# Patient Record
Sex: Female | Born: 1995 | Race: Black or African American | Hispanic: No | Marital: Single | Smoking: Never smoker
Health system: Southern US, Community
[De-identification: ages and names within clinical notes are randomized; demographics above are authoritative.]

## PROBLEM LIST (undated history)

## (undated) DIAGNOSIS — J45909 Unspecified asthma, uncomplicated: Secondary | ICD-10-CM

---

## 2019-05-25 ENCOUNTER — Other Ambulatory Visit: Payer: Self-pay

## 2019-05-25 DIAGNOSIS — Z20822 Contact with and (suspected) exposure to covid-19: Secondary | ICD-10-CM

## 2019-05-27 LAB — NOVEL CORONAVIRUS, NAA: SARS-CoV-2, NAA: NOT DETECTED

## 2019-12-09 ENCOUNTER — Emergency Department (HOSPITAL_COMMUNITY): Payer: Self-pay

## 2019-12-09 ENCOUNTER — Emergency Department (HOSPITAL_COMMUNITY)
Admission: EM | Admit: 2019-12-09 | Discharge: 2019-12-09 | Disposition: A | Discharge status: Home / Self Care | Payer: Self-pay | Attending: Emergency Medicine | Admitting: Emergency Medicine

## 2019-12-09 ENCOUNTER — Encounter (HOSPITAL_COMMUNITY): Payer: Self-pay

## 2019-12-09 DIAGNOSIS — T486X5A Adverse effect of antiasthmatics, initial encounter: Secondary | ICD-10-CM | POA: Insufficient documentation

## 2019-12-09 DIAGNOSIS — T50905A Adverse effect of unspecified drugs, medicaments and biological substances, initial encounter: Secondary | ICD-10-CM

## 2019-12-09 DIAGNOSIS — T887XXA Unspecified adverse effect of drug or medicament, initial encounter: Secondary | ICD-10-CM | POA: Insufficient documentation

## 2019-12-09 DIAGNOSIS — J4521 Mild intermittent asthma with (acute) exacerbation: Secondary | ICD-10-CM | POA: Insufficient documentation

## 2019-12-09 DIAGNOSIS — Z20822 Contact with and (suspected) exposure to covid-19: Secondary | ICD-10-CM | POA: Insufficient documentation

## 2019-12-09 HISTORY — DX: Unspecified asthma, uncomplicated: J45.909

## 2019-12-09 LAB — CBC WITH DIFFERENTIAL/PLATELET
Abs Immature Granulocytes: 0.04 10*3/uL (ref 0.00–0.07)
Basophils Absolute: 0.1 10*3/uL (ref 0.0–0.1)
Basophils Relative: 1 %
Eosinophils Absolute: 0.7 10*3/uL — ABNORMAL HIGH (ref 0.0–0.5)
Eosinophils Relative: 9 %
HCT: 41.2 % (ref 36.0–46.0)
Hemoglobin: 13.9 g/dL (ref 12.0–15.0)
Immature Granulocytes: 1 %
Lymphocytes Relative: 26 %
Lymphs Abs: 1.9 10*3/uL (ref 0.7–4.0)
MCH: 30.5 pg (ref 26.0–34.0)
MCHC: 33.7 g/dL (ref 30.0–36.0)
MCV: 90.4 fL (ref 80.0–100.0)
Monocytes Absolute: 0.5 10*3/uL (ref 0.1–1.0)
Monocytes Relative: 7 %
Neutro Abs: 4.1 10*3/uL (ref 1.7–7.7)
Neutrophils Relative %: 56 %
Platelets: 277 10*3/uL (ref 150–400)
RBC: 4.56 MIL/uL (ref 3.87–5.11)
RDW: 12.2 % (ref 11.5–15.5)
WBC: 7.3 10*3/uL (ref 4.0–10.5)
nRBC: 0 % (ref 0.0–0.2)

## 2019-12-09 LAB — I-STAT CHEM 8, ED
BUN: 12 mg/dL (ref 6–20)
Calcium, Ion: 1.24 mmol/L (ref 1.15–1.40)
Chloride: 105 mmol/L (ref 98–111)
Creatinine, Ser: 1 mg/dL (ref 0.44–1.00)
Glucose, Bld: 97 mg/dL (ref 70–99)
HCT: 40 % (ref 36.0–46.0)
Hemoglobin: 13.6 g/dL (ref 12.0–15.0)
Potassium: 3.4 mmol/L — ABNORMAL LOW (ref 3.5–5.1)
Sodium: 142 mmol/L (ref 135–145)
TCO2: 25 mmol/L (ref 22–32)

## 2019-12-09 LAB — I-STAT BETA HCG BLOOD, ED (MC, WL, AP ONLY): I-stat hCG, quantitative: <5 m[IU]/mL (ref <5)

## 2019-12-09 LAB — SARS CORONAVIRUS 2 BY RT PCR (HOSPITAL ORDER, PERFORMED IN ~~LOC~~ HOSPITAL LAB): SARS Coronavirus 2: NEGATIVE

## 2019-12-09 MED ORDER — IOHEXOL 350 MG/ML SOLN
100.0000 mL | Freq: Once | INTRAVENOUS | Status: AC | PRN
  Administered 2019-12-09: 100 mL via INTRAVENOUS

## 2019-12-09 MED ORDER — ALBUTEROL SULFATE (2.5 MG/3ML) 0.083% IN NEBU
5.0000 mg | INHALATION_SOLUTION | Freq: Once | RESPIRATORY_TRACT | Status: AC
  Administered 2019-12-09: 5 mg via RESPIRATORY_TRACT
  Filled 2019-12-09: qty 6

## 2019-12-09 MED ORDER — SODIUM CHLORIDE 0.9 % IV BOLUS
500.0000 mL | Freq: Once | INTRAVENOUS | Status: AC
  Administered 2019-12-09: 500 mL via INTRAVENOUS

## 2019-12-09 MED ORDER — PREDNISONE 20 MG PO TABS: ORAL_TABLET | ORAL | 0 refills | Status: AC

## 2019-12-09 MED ORDER — PREDNISONE 20 MG PO TABS
60.0000 mg | ORAL_TABLET | Freq: Once | ORAL | Status: AC
  Administered 2019-12-09: 60 mg via ORAL
  Filled 2019-12-09: qty 3

## 2019-12-09 MED ORDER — SODIUM CHLORIDE (PF) 0.9 % IJ SOLN
INTRAMUSCULAR | Status: AC
  Filled 2019-12-09: qty 50

## 2019-12-09 NOTE — ED Triage Notes (Signed)
Arrived POV from home patient reports cough and shortness and breath since around 1AM. Patient reports using nebulizer treatment and neb treatment without any improvement

## 2019-12-09 NOTE — ED Provider Notes (Signed)
[facility]-EMERGENCY DEPT Provider Note   CSN: [id number] Arrival date & time: 12/09/19  0315     History Chief Complaint  Patient presents with  . asthma exacerbation    Katie Lawrence is a 24 y.o. female.  The history is provided by the patient.  Wheezing Severity:  Moderate Severity compared to prior episodes:  Similar Onset quality:  Sudden Duration:  4 hours Timing:  Constant Progression:  Partially resolved Chronicity:  New Context: not pollens and not smoke exposure   Relieved by:  Nothing Worsened by:  Nothing Ineffective treatments:  Beta-agonist inhaler and home nebulizer (at least 7 treatments since 1 am ) Associated symptoms: no chest pain, no ear pain, no fever, no foot swelling, no headaches, no orthopnea, no PND, no rash, no rhinorrhea, no shortness of breath, no sore throat, no sputum production, no stridor and no swollen glands   Risk factors: not exposed to toxic fumes        Past Medical History:  Diagnosis Date  . Asthma     There are no problems to display for this patient.   History reviewed. No pertinent surgical history.   OB History   No obstetric history on file.     History reviewed. No pertinent family history.  Social History   Tobacco Use  . Smoking status: Never Smoker  . Smokeless tobacco: Never Used  Substance Use Topics  . Alcohol use: Never  . Drug use: Never    Home Medications Prior to Admission medications   Medication Sig Start Date End Date Taking? Authorizing Provider  predniSONE (DELTASONE) 20 MG tablet 3 tabs po day one, then 2 po daily x 4 days 12/09/19   Nicanor Alcon, Diasha Castleman, MD    Allergies    Patient has no known allergies.  Review of Systems   Review of Systems  Constitutional: Negative for fever.  HENT: Negative for ear pain, rhinorrhea and sore throat.   Eyes: Negative for photophobia.  Respiratory: Positive for wheezing. Negative for sputum production, shortness of breath and  stridor.   Cardiovascular: Negative for chest pain, orthopnea and PND.  Gastrointestinal: Negative for abdominal pain.  Skin: Negative for rash.  Neurological: Negative for headaches.  Psychiatric/Behavioral: Negative for agitation.  All other systems reviewed and are negative.   Physical Exam Updated Vital Signs BP 114/60 (BP Location: Left Arm)   Pulse (!) 120   Temp 98.5 F (36.9 C) (Oral)   Resp 13   LMP 12/06/2019 Comment: negative beta HCG 12/09/19  SpO2 100%   Physical Exam Vitals and nursing note reviewed.  Constitutional:      General: She is not in acute distress.    Appearance: Normal appearance.  HENT:     Head: Normocephalic and atraumatic.     Nose: Nose normal.  Eyes:     Conjunctiva/sclera: Conjunctivae normal.     Pupils: Pupils are equal, round, and reactive to light.  Cardiovascular:     Rate and Rhythm: Normal rate and regular rhythm.     Pulses: Normal pulses.     Heart sounds: Normal heart sounds.  Pulmonary:     Effort: Pulmonary effort is normal. No respiratory distress.     Breath sounds: Normal breath sounds. No wheezing or rales.  Abdominal:     General: Abdomen is flat. Bowel sounds are normal.     Tenderness: There is no abdominal tenderness. There is no guarding or rebound.  Musculoskeletal:        General:  Normal range of motion.     Cervical back: Normal range of motion and neck supple.  Skin:    General: Skin is warm and dry.     Capillary Refill: Capillary refill takes less than 2 seconds.  Neurological:     General: No focal deficit present.     Mental Status: She is alert and oriented to person, place, and time.     Deep Tendon Reflexes: Reflexes normal.  Psychiatric:        Mood and Affect: Mood is anxious.     ED Results / Procedures / Treatments   Labs (all labs ordered are listed, but only abnormal results are displayed) Results for orders placed or performed during the hospital encounter of 12/09/19  SARS Coronavirus 2  by RT PCR (hospital order, performed in [facility] hospital lab) Nasopharyngeal Nasopharyngeal Swab   Specimen: Nasopharyngeal Swab  Result Value Ref Range   SARS Coronavirus 2 NEGATIVE NEGATIVE  CBC with Differential/Platelet  Result Value Ref Range   WBC 7.3 4.0 - 10.5 K/uL   RBC 4.56 3.87 - 5.11 MIL/uL   Hemoglobin 13.9 12.0 - 15.0 g/dL   HCT 22.0 36 - 46 %   MCV 90.4 80.0 - 100.0 fL   MCH 30.5 26.0 - 34.0 pg   MCHC 33.7 30.0 - 36.0 g/dL   RDW 25.4 27.0 - 62.3 %   Platelets 277 150 - 400 K/uL   nRBC 0.0 0.0 - 0.2 %   Neutrophils Relative % 56 %   Neutro Abs 4.1 1.7 - 7.7 K/uL   Lymphocytes Relative 26 %   Lymphs Abs 1.9 0.7 - 4.0 K/uL   Monocytes Relative 7 %   Monocytes Absolute 0.5 0 - 1 K/uL   Eosinophils Relative 9 %   Eosinophils Absolute 0.7 (H) 0 - 0 K/uL   Basophils Relative 1 %   Basophils Absolute 0.1 0 - 0 K/uL   Immature Granulocytes 1 %   Abs Immature Granulocytes 0.04 0.00 - 0.07 K/uL  I-stat chem 8, ED (not at [facility] or [facility])  Result Value Ref Range   Sodium 142 135 - 145 mmol/L   Potassium 3.4 (L) 3.5 - 5.1 mmol/L   Chloride 105 98 - 111 mmol/L   BUN 12 6 - 20 mg/dL   Creatinine, Ser 7.62 0.44 - 1.00 mg/dL   Glucose, Bld 97 70 - 99 mg/dL   Calcium, Ion 8.31 5.17 - 1.40 mmol/L   TCO2 25 22 - 32 mmol/L   Hemoglobin 13.6 12.0 - 15.0 g/dL   HCT 61.6 36 - 46 %  I-Stat Beta hCG blood, ED ([facility], [facility], [facility] only)  Result Value Ref Range   I-stat hCG, quantitative <5.0 <5 mIU/mL   Comment 3           DG Chest 2 View  Result Date: 12/09/2019 CLINICAL DATA:  Asthma exacerbation EXAM: CHEST - 2 VIEW COMPARISON:  None. FINDINGS: The heart size and mediastinal contours are within normal limits. Mildly increased reticulonodular opacity and peribronchial cuffing seen within the perihilar regions the visualized skeletal structures are unremarkable. IMPRESSION: Findings which could be suggestive of reactive airway disease Electronically Signed   By: Jonna Clark M.D.   On:  12/09/2019 03:51   CT Angio Chest PE W and/or Wo Contrast  Result Date: 12/09/2019 CLINICAL DATA:  Shortness of breath EXAM: CT ANGIOGRAPHY CHEST WITH CONTRAST TECHNIQUE: Multidetector CT imaging of the chest was performed using the standard protocol during bolus administration of  intravenous contrast. Multiplanar CT image reconstructions and MIPs were obtained to evaluate the vascular anatomy. CONTRAST:  164mL OMNIPAQUE IOHEXOL 350 MG/ML SOLN COMPARISON:  Radiograph same day FINDINGS: Cardiovascular: There is a optimal opacification of the pulmonary arteries. There is no central,segmental, or subsegmental filling defects within the pulmonary arteries. The heart is normal in size. No pericardial effusion or thickening. No evidence right heart strain. There is normal three-vessel brachiocephalic anatomy without proximal stenosis. The thoracic aorta is normal in appearance. Mediastinum/Nodes: No hilar, mediastinal, or axillary adenopathy. Thyroid gland, trachea, and esophagus demonstrate no significant findings. Lungs/Pleura: The lungs are clear. No pleural effusion or pneumothorax. No airspace consolidation. Upper Abdomen: No acute abnormalities present in the visualized portions of the upper abdomen. Musculoskeletal: No chest wall abnormality. No acute or significant osseous findings. Review of the MIP images confirms the above findings. IMPRESSION: No central, segmental, or subsegmental pulmonary embolism. No acute intrathoracic pathology to explain the patient's symptoms Electronically Signed   By: Prudencio Pair M.D.   On: 12/09/2019 05:47    EKG None  Radiology DG Chest 2 View  Result Date: 12/09/2019 CLINICAL DATA:  Asthma exacerbation EXAM: CHEST - 2 VIEW COMPARISON:  None. FINDINGS: The heart size and mediastinal contours are within normal limits. Mildly increased reticulonodular opacity and peribronchial cuffing seen within the perihilar regions the visualized skeletal structures are unremarkable.  IMPRESSION: Findings which could be suggestive of reactive airway disease Electronically Signed   By: Prudencio Pair M.D.   On: 12/09/2019 03:51   CT Angio Chest PE W and/or Wo Contrast  Result Date: 12/09/2019 CLINICAL DATA:  Shortness of breath EXAM: CT ANGIOGRAPHY CHEST WITH CONTRAST TECHNIQUE: Multidetector CT imaging of the chest was performed using the standard protocol during bolus administration of intravenous contrast. Multiplanar CT image reconstructions and MIPs were obtained to evaluate the vascular anatomy. CONTRAST:  197mL OMNIPAQUE IOHEXOL 350 MG/ML SOLN COMPARISON:  Radiograph same day FINDINGS: Cardiovascular: There is a optimal opacification of the pulmonary arteries. There is no central,segmental, or subsegmental filling defects within the pulmonary arteries. The heart is normal in size. No pericardial effusion or thickening. No evidence right heart strain. There is normal three-vessel brachiocephalic anatomy without proximal stenosis. The thoracic aorta is normal in appearance. Mediastinum/Nodes: No hilar, mediastinal, or axillary adenopathy. Thyroid gland, trachea, and esophagus demonstrate no significant findings. Lungs/Pleura: The lungs are clear. No pleural effusion or pneumothorax. No airspace consolidation. Upper Abdomen: No acute abnormalities present in the visualized portions of the upper abdomen. Musculoskeletal: No chest wall abnormality. No acute or significant osseous findings. Review of the MIP images confirms the above findings. IMPRESSION: No central, segmental, or subsegmental pulmonary embolism. No acute intrathoracic pathology to explain the patient's symptoms Electronically Signed   By: Prudencio Pair M.D.   On: 12/09/2019 05:47    Procedures Procedures (including critical care time)  Medications Ordered in ED Medications  albuterol (PROVENTIL) (2.5 MG/3ML) 0.083% nebulizer solution 5 mg (5 mg Nebulization Given 12/09/19 0352)  sodium chloride 0.9 % bolus 500 mL (0 mLs  Intravenous Stopped 12/09/19 0509)  predniSONE (DELTASONE) tablet 60 mg (60 mg Oral Given 12/09/19 0433)  iohexol (OMNIPAQUE) 350 MG/ML injection 100 mL (100 mLs Intravenous Contrast Given 12/09/19 0535)  sodium chloride (PF) 0.9 % injection (  Given 12/09/19 0558)    ED Course  I have reviewed the triage vital signs and the nursing notes.  Pertinent labs & imaging results that were available during my care of the patient were reviewed by me  and considered in my medical decision making (see chart for details).  Tachycardia is from too much albuterol.  Patient is not wheezing and does not need any more albuterol.  Patient is also panicking and hyperventilating.  She is able to control her breathing with direction.  No PE on CT scan.  No PNA.  No signs of infection nor sepsis.  No more albuterol.  Stable for discharge.     [place] Heidt was evaluated in Emergency Department on 12/09/2019 for the symptoms described in the history of present illness. She was evaluated in the context of the global COVID-19 pandemic, which necessitated consideration that the patient might be at risk for infection with the SARS-CoV-2 virus that causes COVID-19. Institutional protocols and algorithms that pertain to the evaluation of patients at risk for COVID-19 are in a state of rapid change based on information released by regulatory bodies including the CDC and federal and state organizations. These policies and algorithms were followed during the patient's care in the ED.  Final Clinical Impression(s) / ED Diagnoses Final diagnoses:  Mild intermittent asthma with exacerbation  Adverse effect of drug, initial encounter  Return for intractable cough, coughing up blood,fevers >100.4 unrelieved by medication, shortness of breath, intractable vomiting, chest pain, shortness of breath, weakness,numbness, changes in speech, facial asymmetry,abdominal pain, passing out,Inability to tolerate liquids or food, cough, altered  mental status or any concerns. No signs of systemic illness or infection. The patient is nontoxic-appearing on exam and vital signs are within normal limits.   I have reviewed the triage vital signs and the nursing notes. Pertinent labs &imaging results that were available during my care of the patient were reviewed by me and considered in my medical decision making (see chart for details).After history, exam, and medical workup I feel the patient has beenappropriately medically screened and is safe for discharge home. Pertinent diagnoses were discussed with the patient. Patient was given return precautions.     Rx / DC Orders ED Discharge Orders         Ordered    predniSONE (DELTASONE) 20 MG tablet     Discontinue  Reprint     12/09/19 0604           Vana Arif, MD 12/09/19 3818

## 2021-06-21 IMAGING — CR DG CHEST 2V
2 series · 2 of 2 positions shown · non-contrast
Comparison: None.

CLINICAL DATA: Asthma exacerbation

EXAM:
CHEST - 2 VIEW

[w chest lat]
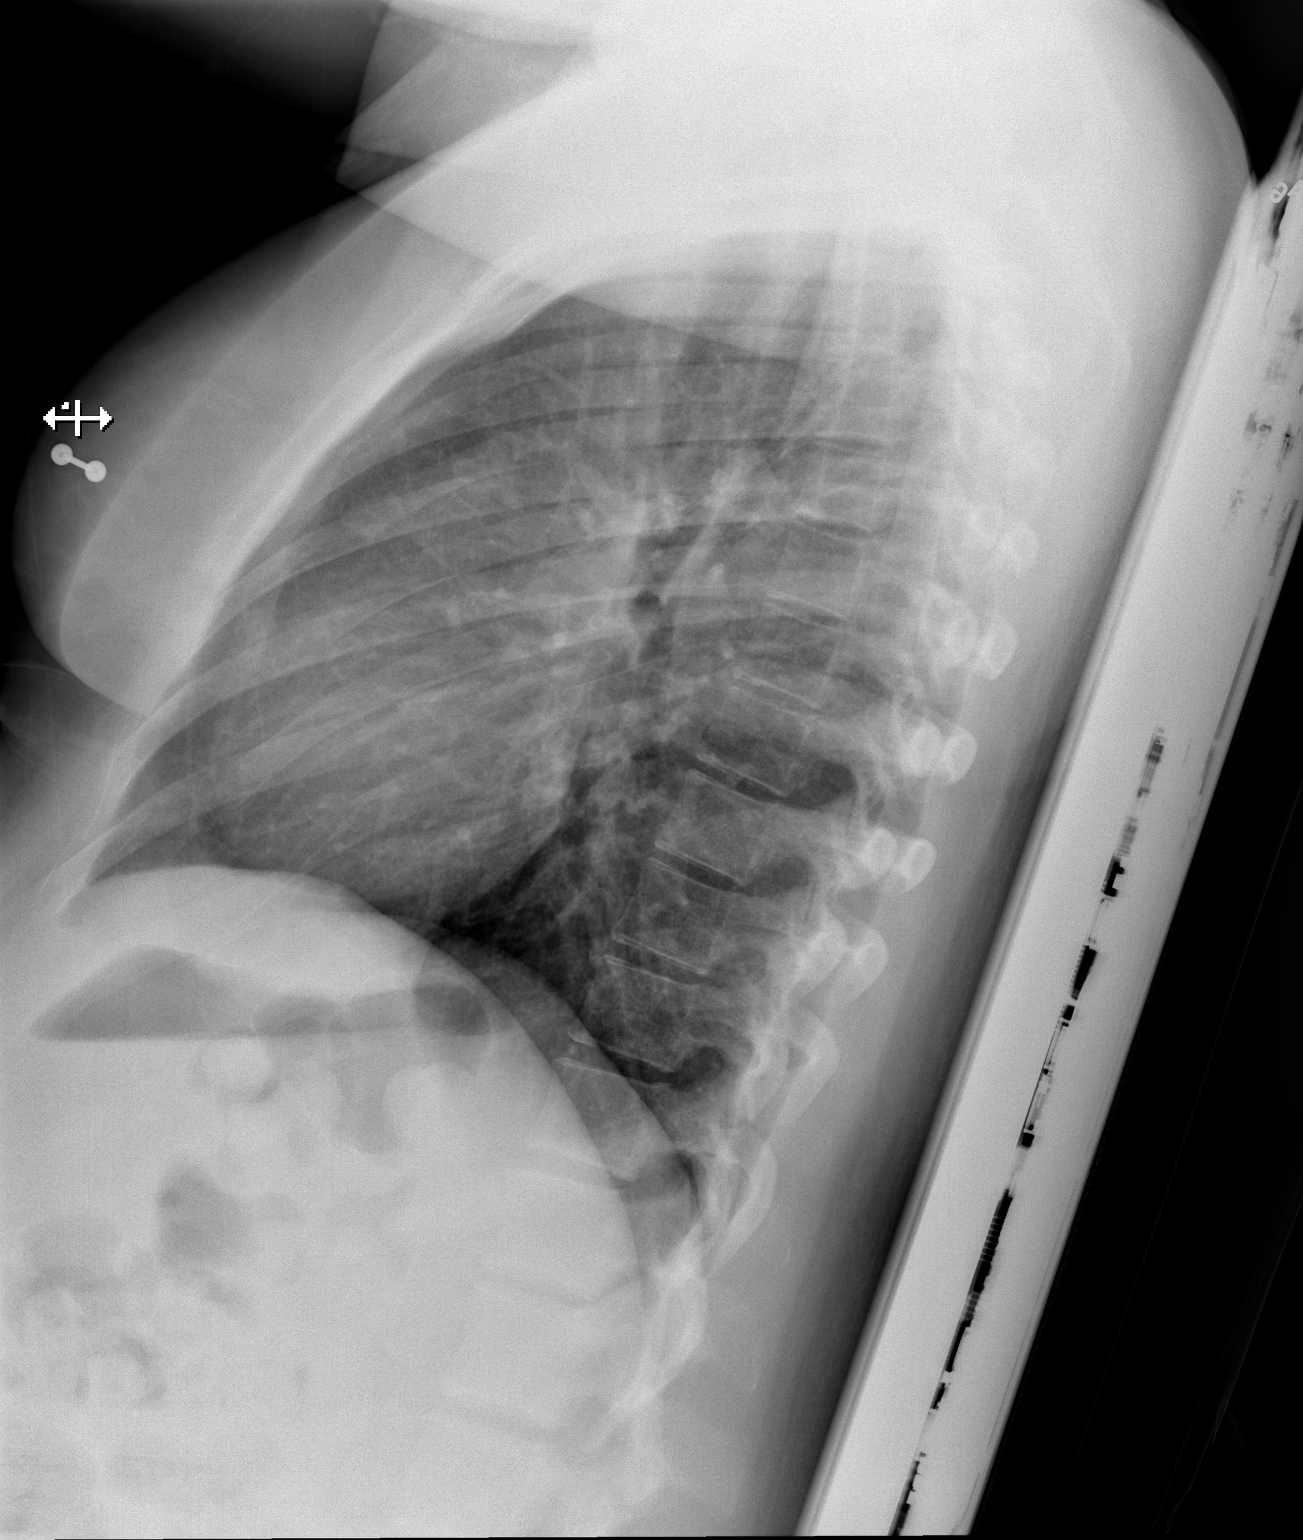

[x chest ap]
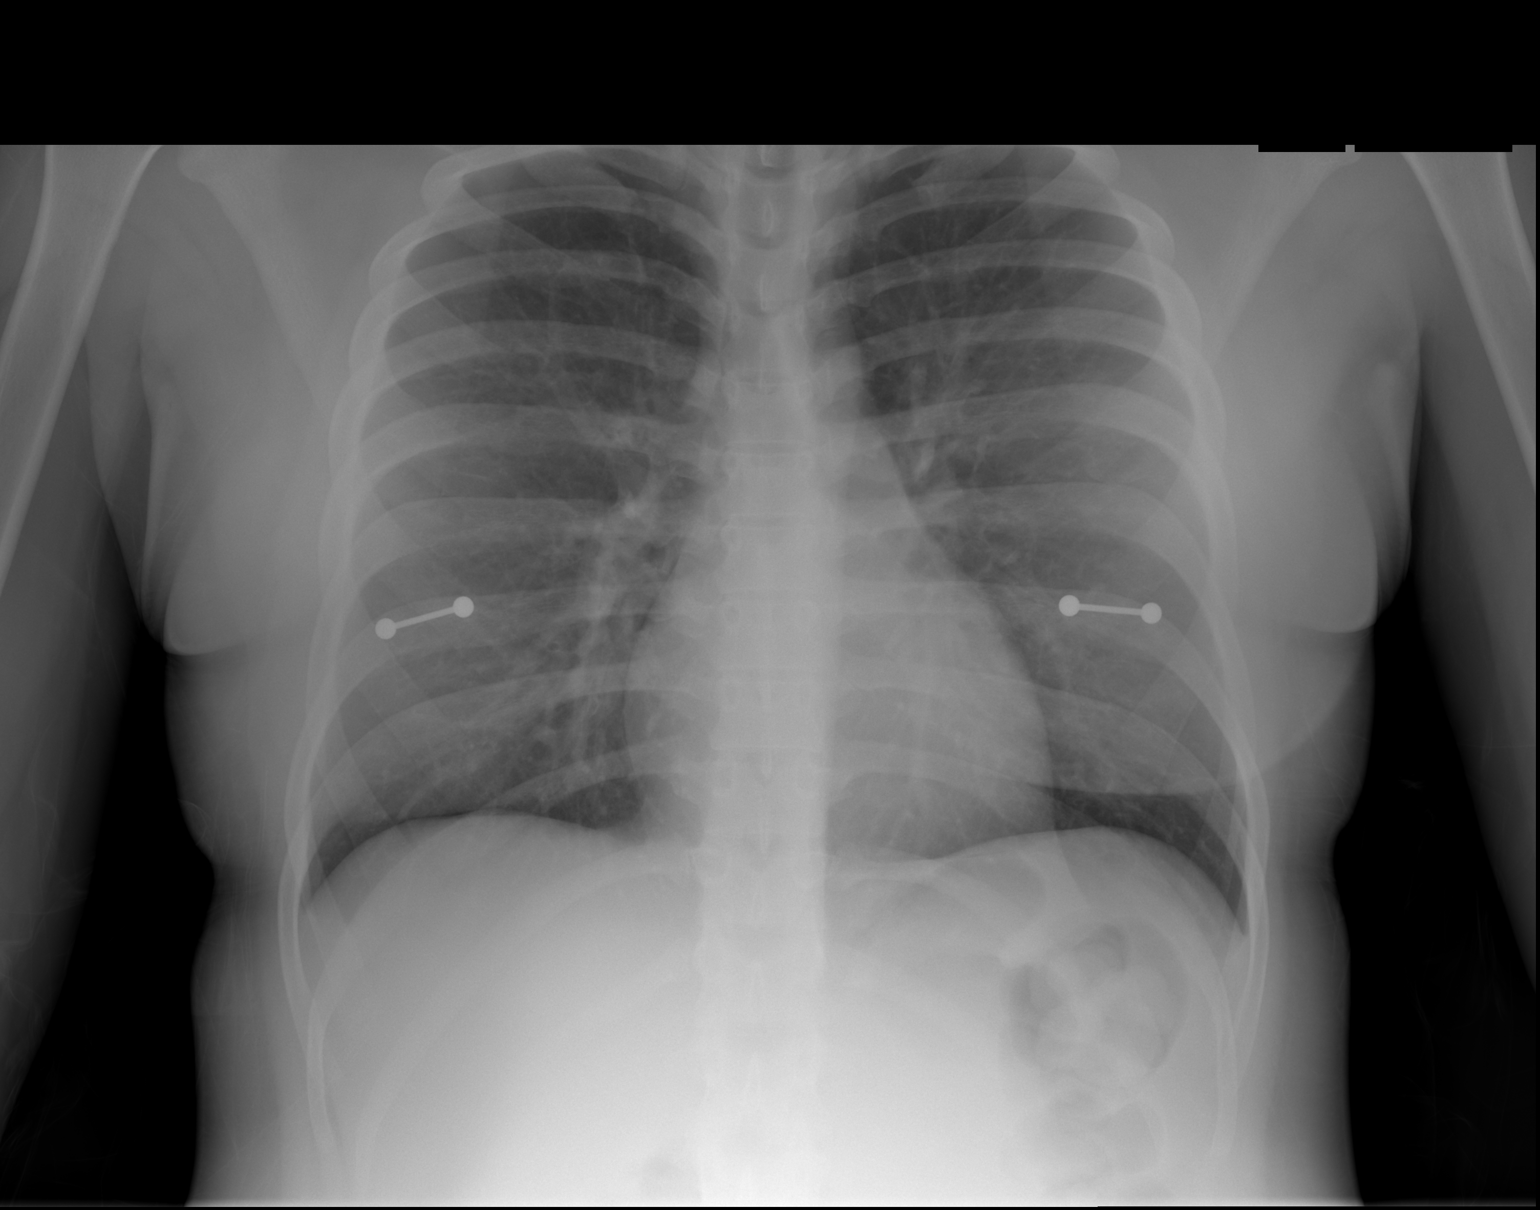

[2 of 2 positions shown; findings below may reference images not displayed]

FINDINGS: The heart size and mediastinal contours are within normal limits.
Mildly increased reticulonodular opacity and peribronchial cuffing
seen within the perihilar regions the visualized skeletal structures
are unremarkable.
IMPRESSION: Findings which could be suggestive of reactive airway disease

## 2021-06-21 IMAGING — CT CT ANGIO CHEST
2 of 6 series · 18 of 36 positions shown · IV contrast (OMNIPAQUE 350)
Comparison: Radiograph same day

CLINICAL DATA: Shortness of breath

EXAM:
CT ANGIOGRAPHY CHEST WITH CONTRAST
TECHNIQUE: Multidetector CT imaging of the chest was performed using the
standard protocol during bolus administration of intravenous
contrast. Multiplanar CT image reconstructions and MIPs were
obtained to evaluate the vascular anatomy.
CONTRAST:  100mL OMNIPAQUE IOHEXOL 350 MG/ML SOLN

[Series 5: thins · axial · 0.56mm/px · z∈[-131,+61]mm · 17 of 216 slices shown]
[im 12/216  lung]
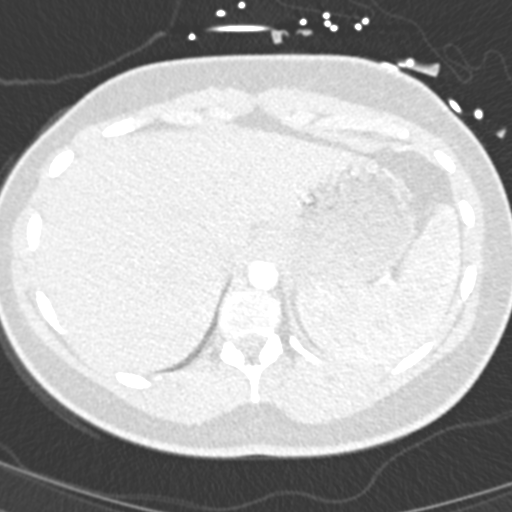
[im 24/216  mediastinal]
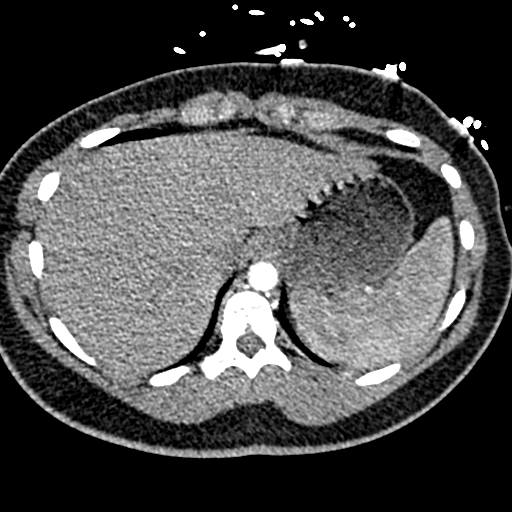
[im 36/216  lung]
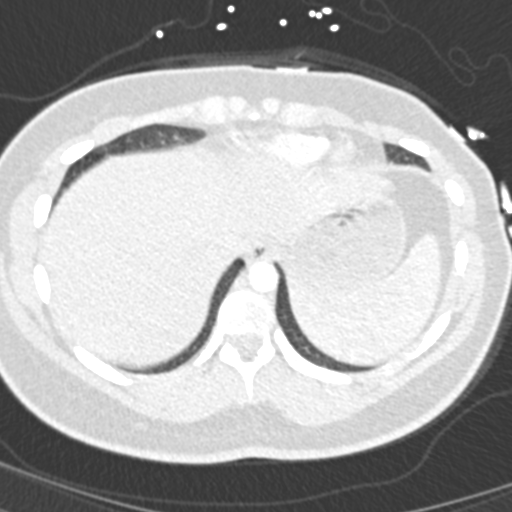
[im 48/216  mediastinal]
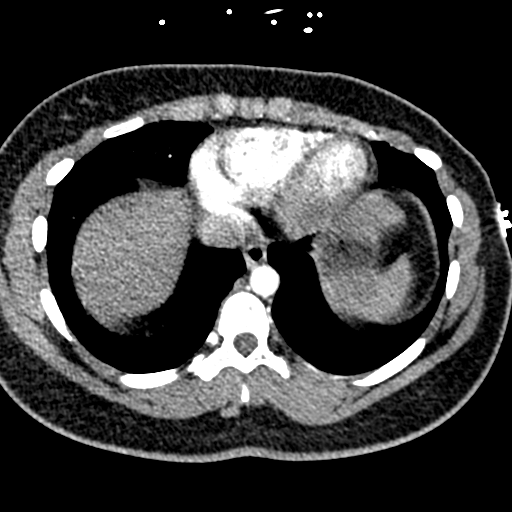
[im 60/216  lung]
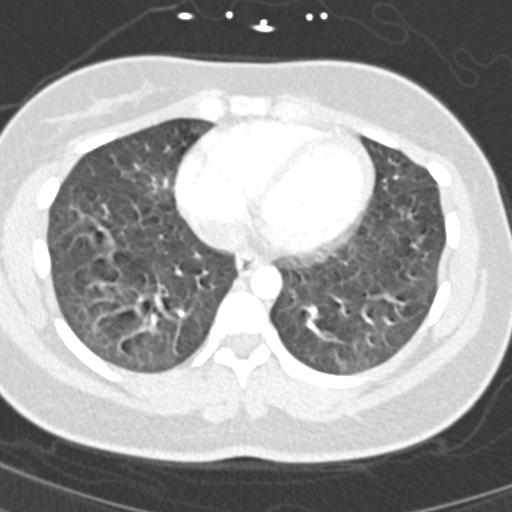
[im 72/216  mediastinal]
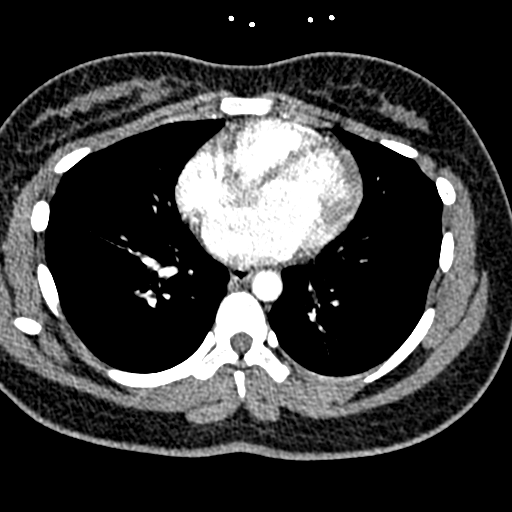
[im 84/216  lung]
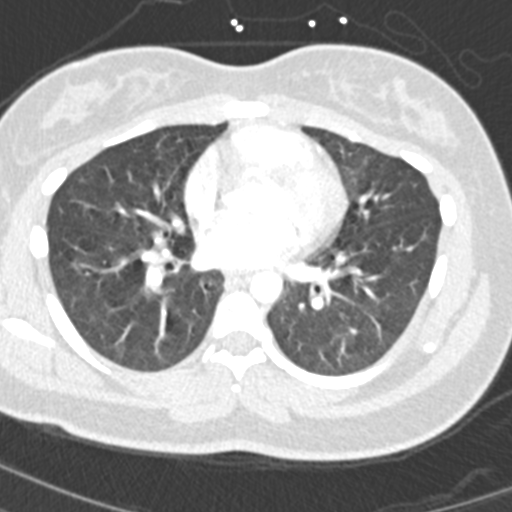
[im 96/216  mediastinal]
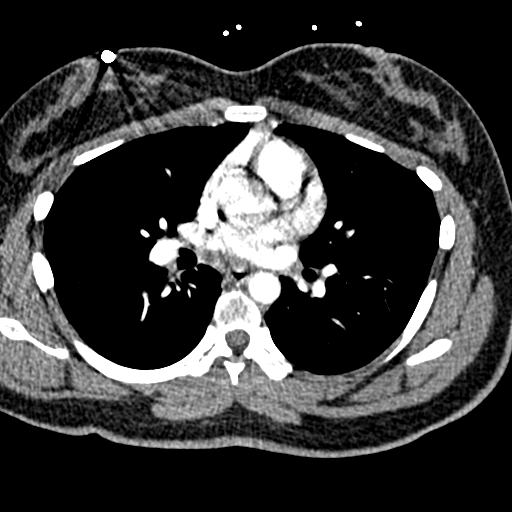
[im 108/216  lung]
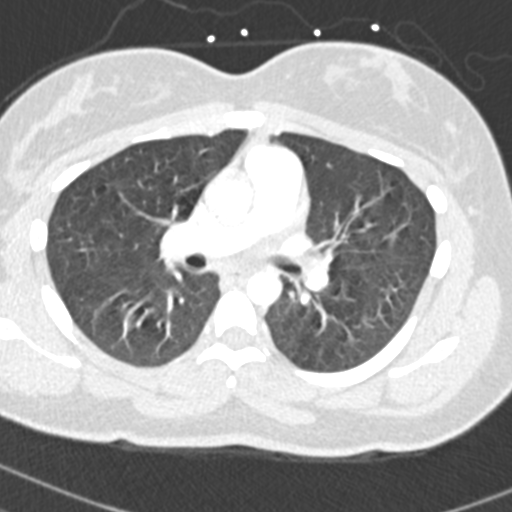
[im 120/216  mediastinal]
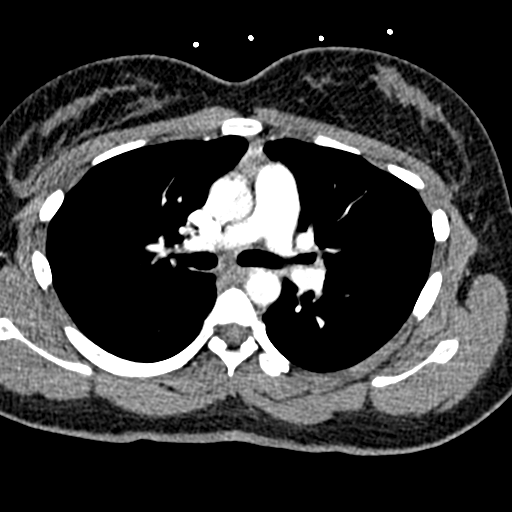
[im 132/216  lung]
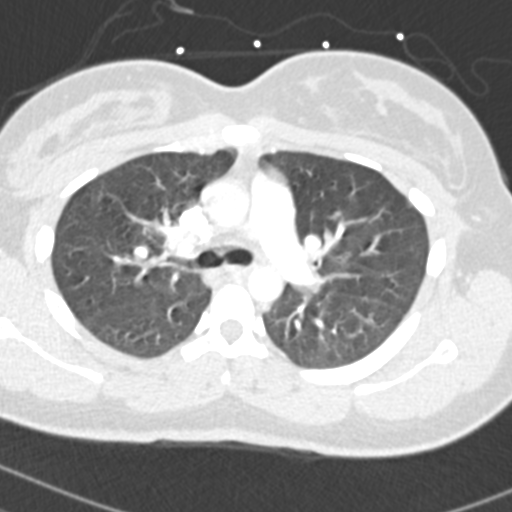
[im 144/216  mediastinal]
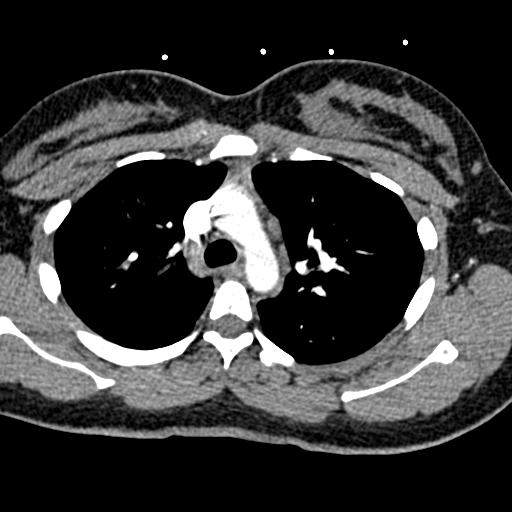
[im 156/216  lung]
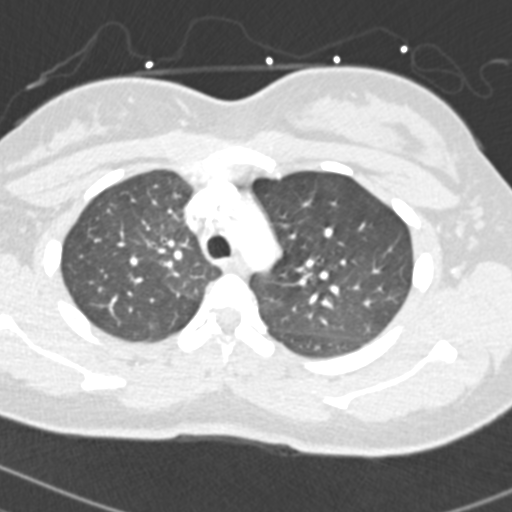
[im 168/216  mediastinal]
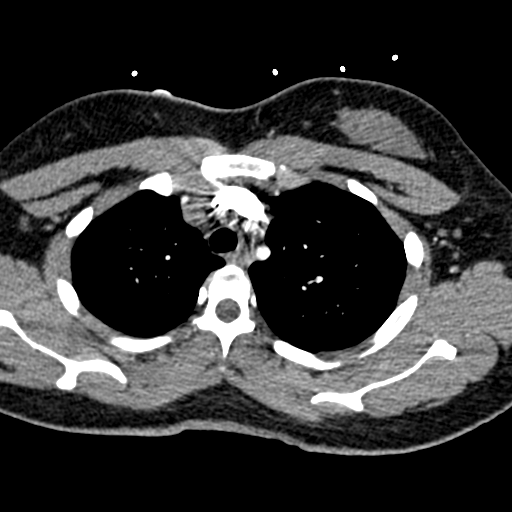
[im 180/216  lung]
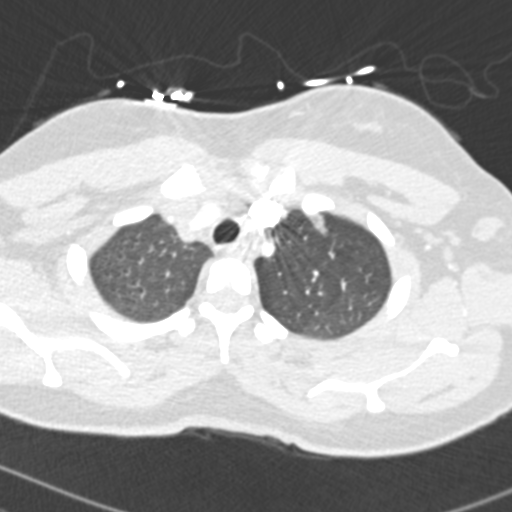
[im 192/216  mediastinal]
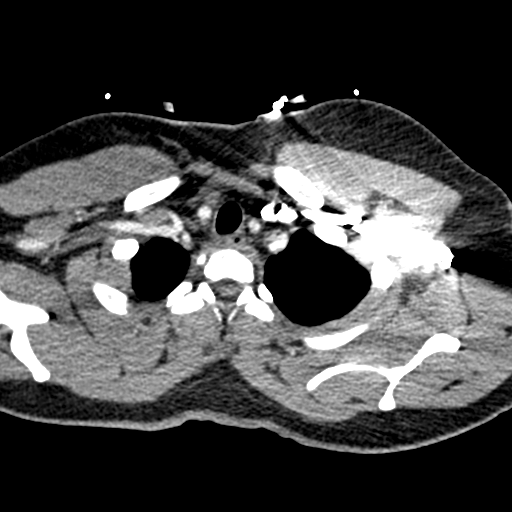
[im 204/216  lung]
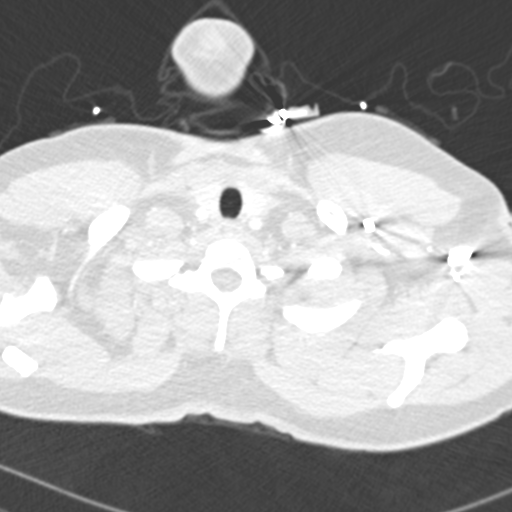

[Series 7: coronal mpr · coronal · 0.45mm/px · 1 of 104 slices shown]
[im 52/104  mediastinal]
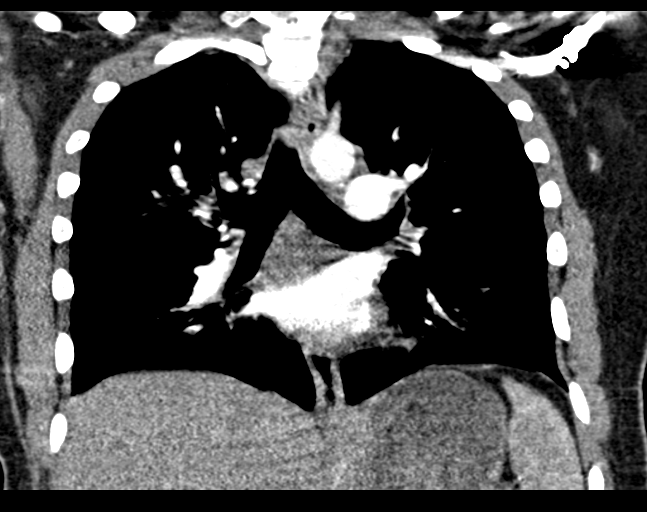

[18 of 36 positions shown; findings below may reference images not displayed]

FINDINGS: Cardiovascular: There is a optimal opacification of the pulmonary
arteries. There is no central,segmental, or subsegmental filling
defects within the pulmonary arteries. The heart is normal in size.
No pericardial effusion or thickening. No evidence right heart
strain. There is normal three-vessel brachiocephalic anatomy without
proximal stenosis. The thoracic aorta is normal in appearance.

Mediastinum/Nodes: No hilar, mediastinal, or axillary adenopathy.
Thyroid gland, trachea, and esophagus demonstrate no significant
findings.

Lungs/Pleura: The lungs are clear. No pleural effusion or
pneumothorax. No airspace consolidation.

Upper Abdomen: No acute abnormalities present in the visualized
portions of the upper abdomen.

Musculoskeletal: No chest wall abnormality. No acute or significant
osseous findings.

Review of the MIP images confirms the above findings.
IMPRESSION: No central, segmental, or subsegmental pulmonary embolism.

No acute intrathoracic pathology to explain the patient's symptoms
